# Patient Record
Sex: Male | Born: 1995 | Hispanic: No | Marital: Single | Smoking: Never smoker
Health system: Southern US, Community
[De-identification: ages and names within clinical notes are randomized; demographics above are authoritative.]

## PROBLEM LIST (undated history)

## (undated) HISTORY — PX: KNEE SURGERY: SHX244

---
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
## 2018-10-27 ENCOUNTER — Emergency Department (HOSPITAL_COMMUNITY): Payer: Self-pay

## 2018-10-27 ENCOUNTER — Emergency Department (HOSPITAL_COMMUNITY)
Admission: EM | Admit: 2018-10-27 | Discharge: 2018-10-27 | Disposition: A | Discharge status: Home / Self Care | Payer: Self-pay | Attending: Emergency Medicine | Admitting: Emergency Medicine

## 2018-10-27 ENCOUNTER — Encounter (HOSPITAL_COMMUNITY): Payer: Self-pay | Admitting: Emergency Medicine

## 2018-10-27 DIAGNOSIS — W108XXA Fall (on) (from) other stairs and steps, initial encounter: Secondary | ICD-10-CM | POA: Insufficient documentation

## 2018-10-27 DIAGNOSIS — M25561 Pain in right knee: Secondary | ICD-10-CM

## 2018-10-27 DIAGNOSIS — Y9302 Activity, running: Secondary | ICD-10-CM | POA: Insufficient documentation

## 2018-10-27 DIAGNOSIS — Y999 Unspecified external cause status: Secondary | ICD-10-CM | POA: Insufficient documentation

## 2018-10-27 DIAGNOSIS — S80211A Abrasion, right knee, initial encounter: Secondary | ICD-10-CM | POA: Insufficient documentation

## 2018-10-27 DIAGNOSIS — Y9289 Other specified places as the place of occurrence of the external cause: Secondary | ICD-10-CM | POA: Insufficient documentation

## 2018-10-27 DIAGNOSIS — M25461 Effusion, right knee: Secondary | ICD-10-CM | POA: Insufficient documentation

## 2018-10-27 MED ORDER — IBUPROFEN 200 MG PO TABS
600.0000 mg | ORAL_TABLET | Freq: Once | ORAL | Status: AC
  Administered 2018-10-27: 600 mg via ORAL
  Filled 2018-10-27: qty 3

## 2018-10-27 NOTE — ED Notes (Signed)
Pt declined crutches.

## 2018-10-27 NOTE — ED Triage Notes (Signed)
Pt reports ran up stairs to see friend who just got into town and fell. Pt c/o right knee pains.

## 2018-10-27 NOTE — ED Provider Notes (Signed)
[facility]-EMERGENCY DEPT Provider Note   CSN: [id number] Arrival date & time: 10/27/18  1050   History   Chief Complaint Chief Complaint  Patient presents with  . Knee Pain    HPI Kenneth Dixon is a 23 y.o. male presenting with constant sharp right anterior knee pain onset 1.5 hours ago. Patient states pain is non radiating. Patient reports he fell on concrete stairs while running up the stairs. Patient states he had an ACL repair 2-3 years ago on the right knee. Patient denies twisting knee or hearing a pop while the injury occurred. Patient reports associated right knee swelling and a small abrasion. Patient states symptoms improve with applying ice. Patient states he is unable to bear weight due to the pain. Patient denies numbness, weakness, or paresthesias. Patient denies fever, chills, nausea, vomiting, or abdominal pain. Patient denies any injuries in any other areas.   HPI  History reviewed. No pertinent past medical history.  There are no active problems to display for this patient.   Past Surgical History:  Procedure Laterality Date  . KNEE SURGERY          Home Medications    Prior to Admission medications   Not on File    Family History No family history on file.  Social History Social History   Tobacco Use  . Smoking status: Never Smoker  . Smokeless tobacco: Never Used  Substance Use Topics  . Alcohol use: Not on file  . Drug use: Not on file     Allergies   Patient has no known allergies.   Review of Systems Review of Systems  Constitutional: Negative for chills, diaphoresis and fever.  Respiratory: Negative for shortness of breath.   Cardiovascular: Negative for chest pain.  Gastrointestinal: Negative for abdominal pain, nausea and vomiting.  Endocrine: Negative for cold intolerance and heat intolerance.  Genitourinary: Negative for dysuria.  Musculoskeletal: Positive for arthralgias, gait problem and joint swelling.  Negative for back pain and myalgias.  Skin: Negative for color change and rash.  Allergic/Immunologic: Negative for immunocompromised state.  Neurological: Negative for weakness and numbness.  Hematological: Negative for adenopathy.     Physical Exam Updated Vital Signs BP 114/86 (BP Location: Left Arm)   Pulse 85   Temp 97.6 F (36.4 C)   Resp 18   SpO2 100%   Physical Exam Vitals signs and nursing note reviewed.  Constitutional:      General: He is not in acute distress.    Appearance: He is well-developed. He is not diaphoretic.  HENT:     Head: Normocephalic and atraumatic.  Cardiovascular:     Rate and Rhythm: Normal rate and regular rhythm.     Heart sounds: Normal heart sounds. No murmur. No friction rub. No gallop.   Pulmonary:     Effort: Pulmonary effort is normal. No respiratory distress.     Breath sounds: Normal breath sounds. No wheezing or rales.  Abdominal:     Palpations: Abdomen is soft.     Tenderness: There is no abdominal tenderness.  Musculoskeletal:     Right knee: He exhibits decreased range of motion (Decreased ROM with extension of knee due to pain. ), swelling and bony tenderness (Tenderness over patella noted. ). He exhibits no effusion, no ecchymosis, no deformity, no laceration, no erythema, normal alignment, no LCL laxity, normal meniscus and no MCL laxity. Tenderness (Anterior knee pain noted over small abrasion. ) found. No medial joint line, no lateral joint line,  no MCL, no LCL and no patellar tendon tenderness noted.     Left knee: Normal. He exhibits normal range of motion, no swelling and no effusion.     Comments: Negative anterior or posterior drawer test. No MCL or LCL laxity. Sensation intact. 5/5 strength with dorsiflexion and plantar flexion. 2+ DP pulses. Pt is able to ambulate, but experiences some pain with ambulation.   Skin:    General: Skin is warm.     Findings: No erythema or rash.  Neurological:     Mental Status: He is  alert.      ED Treatments / Results  Labs (all labs ordered are listed, but only abnormal results are displayed) Labs Reviewed - No data to display  EKG None  Radiology Dg Knee Complete 4 Views Right  Result Date: 10/27/2018 CLINICAL DATA:  Tripped and fell with subsequent pain. EXAM: RIGHT KNEE - COMPLETE 4+ VIEW COMPARISON:  None. FINDINGS: Small knee joint effusion. Previous tunneled ACL repair. No evidence of degenerative change. No acute traumatic bone finding. IMPRESSION: Small joint effusion. Otherwise unremarkable appearance following tunneled ACL repair. Electronically Signed   By: Paulina Fusi M.D.   On: 10/27/2018 13:03    Procedures Procedures (including critical care time)  Medications Ordered in ED Medications  ibuprofen (ADVIL,MOTRIN) tablet 600 mg (600 mg Oral Given 10/27/18 1258)     Initial Impression / Assessment and Plan / ED Course  I have reviewed the triage vital signs and the nursing notes.  Pertinent labs & imaging results that were available during my care of the patient were reviewed by me and considered in my medical decision making (see chart for details).  Clinical Course as of Oct 28 1431  Wed Oct 27, 2018  1308 CXR reveals small joint effusion. Otherwise unremarkable appearance following tunneled ACL repair.    DG Knee Complete 4 Views Right [AH]    Clinical Course User Index [AH] Leretha Dykes, PA-C   Patient X-Ray negative for obvious fracture or dislocation. Pain managed in ED. Pt advised to follow up with orthopedics if symptoms persist for possibility of missed fracture diagnosis. Patient given brace while in ED, conservative therapy recommended and discussed. Patient will be dc home & is agreeable with above plan.   Final Clinical Impressions(s) / ED Diagnoses   Final diagnoses:  Acute pain of right knee    ED Discharge Orders    None       Leretha Dykes, [place] 10/27/18 1433    Doug Sou, MD 10/27/18 [phone]

## 2018-10-27 NOTE — Discharge Instructions (Addendum)
Today you were evaluated for knee pain.   1. Medications: alternate ibuprofen and tylenol for pain control, usual home medications 2. Treatment: rest, ice, elevate and use brace, drink plenty of fluids, gentle stretching 3. Follow Up: Please followup with orthopedics as directed or your PCP in 1 week if no improvement for discussion of your diagnoses and further evaluation after today's visit; if you do not have a primary care doctor use the resource guide provided to find one; Please return to the ER for worsening symptoms or other concerns  Emergency Department Resource Guide 1) Find a Doctor and Pay Out of Pocket Although you won't have to find out who is covered by your insurance plan, it is a good idea to ask around and get recommendations. You will then need to call the office and see if the doctor you have chosen will accept you as a new patient and what types of options they offer for patients who are self-pay. Some doctors offer discounts or will set up payment plans for their patients who do not have insurance, but you will need to ask so you aren't surprised when you get to your appointment.  2) Contact Your Local Health Department Not all health departments have doctors that can see patients for sick visits, but many do, so it is worth a call to see if yours does. If you don't know where your local health department is, you can check in your phone book. The CDC also has a tool to help you locate your state's health department, and many state websites also have listings of all of their local health departments.  3) Find a Walk-in Clinic If your illness is not likely to be very severe or complicated, you may want to try a walk in clinic. These are popping up all over the country in pharmacies, drugstores, and shopping centers. They're usually staffed by nurse practitioners or physician assistants that have been trained to treat common illnesses and complaints. They're usually fairly quick and  inexpensive. However, if you have serious medical issues or chronic medical problems, these are probably not your best option.  No Primary Care Doctor: Call Health Connect at  [phone] - they can help you locate a primary care doctor that  accepts your insurance, provides certain services, etc. Physician Referral [phone]- [phone]  Emergency Department Resource Guide 1) Find a Doctor and Pay Out of Pocket Although you won't have to find out who is covered by your insurance plan, it is a good idea to ask around and get recommendations. You will then need to call the office and see if the doctor you have chosen will accept you as a new patient and what types of options they offer for patients who are self-pay. Some doctors offer discounts or will set up payment plans for their patients who do not have insurance, but you will need to ask so you aren't surprised when you get to your appointment.  2) Contact Your Local Health Department Not all health departments have doctors that can see patients for sick visits, but many do, so it is worth a call to see if yours does. If you don't know where your local health department is, you can check in your phone book. The CDC also has a tool to help you locate your state's health department, and many state websites also have listings of all of their local health departments.  3) Find a Walk-in Clinic If your illness is not likely to be very  severe or complicated, you may want to try a walk in clinic. These are popping up all over the country in pharmacies, drugstores, and shopping centers. They're usually staffed by nurse practitioners or physician assistants that have been trained to treat common illnesses and complaints. They're usually fairly quick and inexpensive. However, if you have serious medical issues or chronic medical problems, these are probably not your best option.  No Primary Care Doctor: Call Health Connect at  [phone] - they can help you  locate a primary care doctor that  accepts your insurance, provides certain services, etc. Physician Referral Service- [phone]  Chronic Pain Problems: Organization         Address  Phone   Notes  [facility]  [phone] Patients need to be referred by their primary care doctor.   Medication Assistance: Organization         Address  Phone   Notes  [facility] Medication [facility] [address] [place], [place] [address] [phone] --Must be a resident of [facility] -- Must have NO insurance coverage whatsoever (no Medicaid/ Medicare, etc.) -- The pt. MUST have a primary care doctor that directs their care regularly and follows them in the community   [facility]  [phone]   [employer]  [phone]    Agencies that provide inexpensive medical care: Organization         Address  Phone   Notes  [facility]  [phone]   [facility]    [phone]   [facility] [address] [place], [place] [address] [phone]   [facility] [address] [place] [place] [phone]   [facility]    [phone]   [facility]    [phone]   [facility]  [address], [place] Phone:  [phone], Fax:  [phone] Hours of Operation:  9 am - 6 pm, M-F.  Also accepts Medicaid/Medicare and self-pay.  [facility]  [address], [place] Phone: [phone], Fax: [phone]. Hours of Operation:  8:30 am - 5:30 pm, M-F.  Also accepts Medicaid and self-pay.  [facility] [place] [address] [place] Phone: [phone]   [facility] [address] Natasha Bence [place], [place] [phone], Ext. [phone] Mondays & Thursdays: 7-9 AM.  First 15 patients are seen on a first come, first serve basis.    Medicaid-accepting [facility]  Providers:  Organization         Address  Phone   Notes  [facility] [address], [place] [phone] Also accepts self-pay patients.  [facility] [address] Laurell Josephs [place], [place]  [phone]   [facility] [address], [place] [phone]   [facility] [address] [place] [phone]   Renaye Rakers [address], [place]   [phone] Only accepts [place] patients after they have their name applied to their card.   Self-Pay (no insurance) in [facility]:  Organization         Address  Phone   Notes  Sickle Cell Patients, [facility] [address] [place], [place] [phone]   [facility] [address]  [address], [place] [phone]   [facility] [place]  [place], [place], [place] [phone]   [facility]/Dr. Osei-Bonsu  [address] [place] or [address], [place] [phone] Phone number for both [place] and [place] locations is the same.  [facility] [address], [place] [phone]   [facility] [address], [place] or [address] [phone]   [facility] [address], [place] [phone], phone; [phone], fax Sees patients 1st and 3rd Saturday of every month.  Must not qualify for public or private insurance (i.e. Medicaid, Medicare, [place] Health Choice, Veterans' Benefits)  Household income should be no more than 200% of the poverty level The clinic cannot treat you if you are pregnant or think you are pregnant  Sexually transmitted diseases are not treated at the clinic.

## 2019-03-17 ENCOUNTER — Other Ambulatory Visit: Payer: Self-pay

## 2019-03-17 ENCOUNTER — Emergency Department (HOSPITAL_COMMUNITY)
Admission: EM | Admit: 2019-03-17 | Discharge: 2019-03-17 | Disposition: A | Discharge status: Home / Self Care | Payer: Self-pay | Attending: Emergency Medicine | Admitting: Emergency Medicine

## 2019-03-17 DIAGNOSIS — F19950 Other psychoactive substance use, unspecified with psychoactive substance-induced psychotic disorder with delusions: Secondary | ICD-10-CM

## 2019-03-17 DIAGNOSIS — F16122 Hallucinogen abuse with intoxication with perceptual disturbance: Secondary | ICD-10-CM | POA: Insufficient documentation

## 2019-03-17 DIAGNOSIS — R000 Tachycardia, unspecified: Secondary | ICD-10-CM | POA: Insufficient documentation

## 2019-03-17 DIAGNOSIS — F1910 Other psychoactive substance abuse, uncomplicated: Secondary | ICD-10-CM

## 2019-03-17 DIAGNOSIS — F22 Delusional disorders: Secondary | ICD-10-CM

## 2019-03-17 LAB — COMPREHENSIVE METABOLIC PANEL
ALT: 17 U/L (ref 0–44)
AST: 24 U/L (ref 15–41)
Albumin: 4.7 g/dL (ref 3.5–5.0)
Alkaline Phosphatase: 55 U/L (ref 38–126)
Anion gap: 18 — ABNORMAL HIGH (ref 5–15)
BUN: 8 mg/dL (ref 6–20)
CO2: 20 mmol/L — ABNORMAL LOW (ref 22–32)
Calcium: 9.7 mg/dL (ref 8.9–10.3)
Chloride: 105 mmol/L (ref 98–111)
Creatinine, Ser: 0.92 mg/dL (ref 0.61–1.24)
GFR calc Af Amer: >60 mL/min (ref >60)
GFR calc non Af Amer: >60 mL/min (ref >60)
Glucose, Bld: 107 mg/dL — ABNORMAL HIGH (ref 70–99)
Potassium: 2.9 mmol/L — ABNORMAL LOW (ref 3.5–5.1)
Sodium: 143 mmol/L (ref 135–145)
Total Bilirubin: 1 mg/dL (ref 0.3–1.2)
Total Protein: 7.5 g/dL (ref 6.5–8.1)

## 2019-03-17 LAB — RAPID URINE DRUG SCREEN, HOSP PERFORMED
Amphetamines: NOT DETECTED
Barbiturates: NOT DETECTED
Benzodiazepines: NOT DETECTED
Cocaine: NOT DETECTED
Opiates: NOT DETECTED
Tetrahydrocannabinol: POSITIVE — AB

## 2019-03-17 LAB — CBC WITH DIFFERENTIAL/PLATELET
Abs Immature Granulocytes: 0.02 10*3/uL (ref 0.00–0.07)
Basophils Absolute: 0 10*3/uL (ref 0.0–0.1)
Basophils Relative: 1 %
Eosinophils Absolute: 0.1 10*3/uL (ref 0.0–0.5)
Eosinophils Relative: 1 %
HCT: 45 % (ref 39.0–52.0)
Hemoglobin: 15.6 g/dL (ref 13.0–17.0)
Immature Granulocytes: 0 %
Lymphocytes Relative: 22 %
Lymphs Abs: 1.2 10*3/uL (ref 0.7–4.0)
MCH: 31 pg (ref 26.0–34.0)
MCHC: 34.7 g/dL (ref 30.0–36.0)
MCV: 89.3 fL (ref 80.0–100.0)
Monocytes Absolute: 0.5 10*3/uL (ref 0.1–1.0)
Monocytes Relative: 9 %
Neutro Abs: 3.7 10*3/uL (ref 1.7–7.7)
Neutrophils Relative %: 67 %
Platelets: 290 10*3/uL (ref 150–400)
RBC: 5.04 MIL/uL (ref 4.22–5.81)
RDW: 12.1 % (ref 11.5–15.5)
WBC: 5.5 10*3/uL (ref 4.0–10.5)
nRBC: 0 % (ref 0.0–0.2)

## 2019-03-17 LAB — SALICYLATE LEVEL: Salicylate Lvl: <7 mg/dL (ref 2.8–30.0)

## 2019-03-17 LAB — ETHANOL: Alcohol, Ethyl (B): 27 mg/dL — ABNORMAL HIGH (ref <10)

## 2019-03-17 LAB — ACETAMINOPHEN LEVEL: Acetaminophen (Tylenol), Serum: <10 ug/mL — ABNORMAL LOW (ref 10–30)

## 2019-03-17 MED ORDER — LORAZEPAM 2 MG/ML IJ SOLN: 2.0000 mg | Freq: Once | INTRAMUSCULAR | Status: DC

## 2019-03-17 MED ORDER — LORAZEPAM 2 MG/ML IJ SOLN
2.0000 mg | Freq: Once | INTRAMUSCULAR | Status: AC
  Administered 2019-03-17: 2 mg via INTRAMUSCULAR
  Filled 2019-03-17: qty 1

## 2019-03-17 MED ORDER — SODIUM CHLORIDE 0.9 % IV BOLUS: 500.0000 mL | Freq: Once | INTRAVENOUS | Status: DC

## 2019-03-17 MED ORDER — SODIUM CHLORIDE 0.9 % IV BOLUS: 1000.0000 mL | Freq: Once | INTRAVENOUS | Status: DC

## 2019-03-17 MED ORDER — SODIUM CHLORIDE 0.9 % IV SOLN: INTRAVENOUS | Status: DC

## 2019-03-17 NOTE — ED Provider Notes (Signed)
[facility] EMERGENCY DEPARTMENT Provider Note  CSN: [id number] Arrival date & time: 03/17/19 0458  Chief Complaint(s) Paranoid  HPI Kenneth Dixon is a 23 y.o. male brought in by [facility] and EMS for excited delirium.  Patient endorsed using LSD and nitrous oxide at a party.  He called 911 from a campus Blue light call system stating that he saw dead people in his apartment.  Patient continues to ask about his brother Kenneth Dixon who lives in [place], asking whether he is alive or dead.  Remainder of history, ROS, and physical exam limited due to patient's condition (excited delirium). Additional information was obtained from EMS in [employer].   Level V Caveat.    HPI  Past Medical History No past medical history on file. There are no active problems to display for this patient.  Home Medication(s) Prior to Admission medications   Not on File                                                                                                                                    Past Surgical History Past Surgical History:  Procedure Laterality Date  . KNEE SURGERY     Family History No family history on file.  Social History Social History   Tobacco Use  . Smoking status: Never Smoker  . Smokeless tobacco: Never Used  Substance Use Topics  . Alcohol use: Not on file  . Drug use: Not on file   Allergies Patient has no known allergies.  Review of Systems Review of Systems  Unable to perform ROS: Mental status change    Physical Exam Vital Signs  I have reviewed the triage vital signs BP (!) 156/100 (BP Location: Right Arm)   Pulse (!) 115   Temp 98.5 F (36.9 C) (Oral)   SpO2 99%   Physical Exam Vitals signs reviewed.  Constitutional:      General: He is not in acute distress.    Appearance: He is well-developed. He is not diaphoretic.  HENT:     Head: Normocephalic and atraumatic.     Nose: Nose normal.  Eyes:     General: No scleral icterus.     Right eye: No discharge.        Left eye: No discharge.     Conjunctiva/sclera: Conjunctivae normal.     Pupils: Pupils are equal, round, and reactive to light.  Neck:     Musculoskeletal: Normal range of motion and neck supple.  Cardiovascular:     Rate and Rhythm: Regular rhythm. Tachycardia present.     Heart sounds: No murmur. No friction rub. No gallop.   Pulmonary:     Effort: Pulmonary effort is normal. No respiratory distress.     Breath sounds: Normal breath sounds. No stridor. No rales.  Abdominal:     General: There is no distension.     Palpations: Abdomen is soft.  Tenderness: There is no abdominal tenderness.  Musculoskeletal:        General: No tenderness.  Skin:    General: Skin is warm and dry.     Findings: No erythema or rash.       Neurological:     Mental Status: He is alert and oriented to person, place, and time.     Motor: Motor function is intact.     ED Results and Treatments Labs (all labs ordered are listed, but only abnormal results are displayed) Labs Reviewed  CBC WITH DIFFERENTIAL/PLATELET  COMPREHENSIVE METABOLIC PANEL  SALICYLATE LEVEL  ACETAMINOPHEN LEVEL  ETHANOL  RAPID URINE DRUG SCREEN, HOSP PERFORMED  CBG MONITORING, ED                                                                                                                         EKG  EKG Interpretation  Date/Time:  Thursday Mar 17 2019 05:09:23 EDT Ventricular Rate:  113 PR Interval:    QRS Duration: 86 QT Interval:  321 QTC Calculation: 441 R Axis:   81 Text Interpretation:  Sinus tachycardia Right atrial enlargement RSR' in V1 or V2, probably normal variant LVH by voltage No old tracing to compare Confirmed by Drema Pryardama, Sharrie Self [phone]([id number]) on 03/17/2019 6:41:51 AM      Radiology No results found. Pertinent labs & imaging results that were available during my care of the patient were reviewed by me and considered in my medical decision making (see chart for details).   Medications Ordered in ED Medications  sodium chloride 0.9 % bolus 1,000 mL (has no administration in time range)    And  sodium chloride 0.9 % bolus 1,000 mL (has no administration in time range)    And  0.9 %  sodium chloride infusion (has no administration in time range)  LORazepam (ATIVAN) injection 2 mg (has no administration in time range)  LORazepam (ATIVAN) injection 2 mg (2 mg Intramuscular Given 03/17/19 0526)                                                                                                                                    Procedures Procedures  (including critical care time)  Medical Decision Making / ED Course I have reviewed the nursing notes for this encounter and the patient's prior records (if available in EHR or on provided paperwork).    Patient presents with  excited delirium in the setting of reported LSD and nitrous oxide use.  Required chemical sedation for agitation. No signs of acute trauma.  Screening labs obtained.  EKG with sinus tachycardia, and nonischemic.  Intervals within normal limits. Will provide with IV fluids and allow patient to metabolize.  Patient care turned over to Dr Jodi Mourning at 0700. Patient case and results discussed in detail; please see their note for further ED managment.      Final Clinical Impression(s) / ED Diagnoses Final diagnoses:  Substance abuse (HCC)  Paranoia (HCC)  Drug induced delusional state (HCC)      This chart was dictated using voice recognition software.  Despite best efforts to proofread,  errors can occur which can change the documentation meaning.   Nira Conn, MD 03/17/19 [phone]

## 2019-03-17 NOTE — ED Notes (Signed)
Pt is Awake, Alert and Oriented at this time.  VSS.  EDP made aware that pt is asking to go home.

## 2019-03-17 NOTE — ED Provider Notes (Signed)
Pt signed out by Dr. Jodi Mourning.  Pt has slept most of the day.  He is now awake and alert.  He said he used acid and had a bad trip.  He is encouraged to avoid any drugs of abuse.  He is given a Facilities manager for outpatient substance abuse help.  Return if worse.     Jacalyn Lefevre, MD 03/17/19 1346

## 2019-03-17 NOTE — Discharge Instructions (Signed)
Avoid any drugs of abuse.

## 2019-03-17 NOTE — ED Triage Notes (Signed)
Per EMS pt was found by [employer] tonight after going to a party. Pt admitted to taking LSD and whip its. Pt has no complaints but states he was seeing dead bodies. Pt appears anxious  and paranoid. Pt alert and oriented x4.

## 2019-03-17 NOTE — ED Provider Notes (Signed)
Patient's care signed out to continue to monitor after he admitted to doing LSD and nitrous oxide at a party.  Patient is done this before.  I discussed with him the risks of this which include possible death.  Patient is feeling better plan to continue to monitor.  Patient's heart rate 110 in the room.     Blane Ohara, MD 03/18/19 1505

## 2021-06-17 ENCOUNTER — Encounter (HOSPITAL_COMMUNITY): Payer: Self-pay

## 2021-06-17 ENCOUNTER — Other Ambulatory Visit: Payer: Self-pay

## 2021-06-17 ENCOUNTER — Emergency Department (HOSPITAL_COMMUNITY)
Admission: EM | Admit: 2021-06-17 | Discharge: 2021-06-18 | Disposition: A | Discharge status: Home / Self Care | Payer: Self-pay | Attending: Emergency Medicine | Admitting: Emergency Medicine

## 2021-06-17 DIAGNOSIS — T148XXA Other injury of unspecified body region, initial encounter: Secondary | ICD-10-CM

## 2021-06-17 DIAGNOSIS — S5012XA Contusion of left forearm, initial encounter: Secondary | ICD-10-CM | POA: Insufficient documentation

## 2021-06-17 DIAGNOSIS — X58XXXA Exposure to other specified factors, initial encounter: Secondary | ICD-10-CM | POA: Insufficient documentation

## 2021-06-17 DIAGNOSIS — R58 Hemorrhage, not elsewhere classified: Secondary | ICD-10-CM | POA: Insufficient documentation

## 2021-06-17 NOTE — ED Triage Notes (Signed)
Pt has a bruise to his left arm that has a small knot under it x 7 days.

## 2021-06-18 NOTE — ED Provider Notes (Signed)
[facility]-EMERGENCY DEPT Provider Note   CSN: [id number] Arrival date & time: 06/17/21  2217     History Chief Complaint  Patient presents with   Bleeding/Bruising    Kenneth Dixon is a 25 y.o. male.  Patient to ED for evaluation of a tender knot on the volar left forearm he noticed a week ago, and bruising adjacent to the knot that appeared yesterday. No injury or trauma. No redness, wound, bleeding or drainage. He is right hand dominant. He denies history of easy bleeding or blood dyscrasias. No associated swelling of the forearm. No history of similar symptoms.   The history is provided by the patient. No language interpreter was used.      History reviewed. No pertinent past medical history.  There are no problems to display for this patient.   Past Surgical History:  Procedure Laterality Date   KNEE SURGERY         History reviewed. No pertinent family history.  Social History   Tobacco Use   Smoking status: Never   Smokeless tobacco: Never  Vaping Use   Vaping Use: Never used    Home Medications Prior to Admission medications   Not on File    Allergies    Patient has no known allergies.  Review of Systems   Review of Systems  Constitutional:  Negative for diaphoresis and fever.  Musculoskeletal:        See HPI.  Skin:  Positive for color change.  Hematological:  Does not bruise/bleed easily.   Physical Exam Updated Vital Signs BP (!) 142/82 (BP Location: Right Arm)   Pulse 83   Temp 98 F (36.7 C) (Oral)   Resp 16   SpO2 100%   Physical Exam Vitals and nursing note reviewed.  Constitutional:      Appearance: He is well-developed.  Pulmonary:     Effort: Pulmonary effort is normal.  Musculoskeletal:        General: No deformity. Normal range of motion.     Cervical back: Normal range of motion.     Comments: There is a 1 cm firm, mobile nodule midshaft volar forearm on left. No redness, fluctuance. FROM all  joints of the left arm, wrist and hand.   Skin:    General: Skin is warm and dry.     Findings: Bruising (Faint bruising midshaft volar forearm.) present.  Neurological:     Mental Status: He is alert and oriented to person, place, and time.    ED Results / Procedures / Treatments   Labs (all labs ordered are listed, but only abnormal results are displayed) Labs Reviewed - No data to display  EKG None  Radiology No results found.  Procedures Procedures   Medications Ordered in ED Medications - No data to display  ED Course  I have reviewed the triage vital signs and the nursing notes.  Pertinent labs & imaging results that were available during my care of the patient were reviewed by me and considered in my medical decision making (see chart for details).    MDM Rules/Calculators/A&P                           Patient to ED with ss/sxs as per HPI.   Very well appearing patient. Exam findings limited to nodule left forearm and mild bruising. No swelling or redness. Distal pulse normal. He denies easy bleeding - no bleeding when brushing his teeth, or  prolonged clotting after minor injury. VSS.   DDX nodule - ganglion cyst vs developing abscess felt less likely vs lipoma. Unknown cause of bruising but not felt related to the nodule the 7 days difference in onset.   Patient reassured. He can be discharged and PCP follow up is recommended if symptoms do not resolve.   Final Clinical Impression(s) / ED Diagnoses Final diagnoses:  None   bruising  Rx / DC Orders ED Discharge Orders     None        Elpidio Anis, PA-C 06/23/21 [id number]    Mesner, Barbara Cower, MD 06/24/21 2310

## 2021-06-18 NOTE — Discharge Instructions (Addendum)
Your exam today is not concerning for any serious health condition.   If your symptoms persist, follow up with primary care. A referral has been provided for you but you can follow up with your choice of health care providers.

## 2021-10-29 ENCOUNTER — Ambulatory Visit (HOSPITAL_COMMUNITY): Payer: Self-pay

## 2022-01-12 ENCOUNTER — Ambulatory Visit (HOSPITAL_COMMUNITY)
Admission: EM | Admit: 2022-01-12 | Discharge: 2022-01-12 | Disposition: A | Discharge status: Home / Self Care | Payer: PRIVATE HEALTH INSURANCE | Attending: Nurse Practitioner | Admitting: Nurse Practitioner

## 2022-01-12 ENCOUNTER — Ambulatory Visit (INDEPENDENT_AMBULATORY_CARE_PROVIDER_SITE_OTHER): Payer: PRIVATE HEALTH INSURANCE

## 2022-01-12 ENCOUNTER — Encounter (HOSPITAL_COMMUNITY): Payer: Self-pay | Admitting: *Deleted

## 2022-01-12 ENCOUNTER — Other Ambulatory Visit: Payer: Self-pay

## 2022-01-12 DIAGNOSIS — M25531 Pain in right wrist: Secondary | ICD-10-CM

## 2022-01-12 NOTE — Discharge Instructions (Signed)
-   Your wrist x-ray today is negative for acute fracture ?-You can take Tylenol/ibuprofen for the pain; you can also use ice ?-If you are doing something where the wrist might be strained, you can use a wrist brace ?-If the wrist pain does not improve over the next few weeks, follow-up with a specialist below-phone numbers are provided ?

## 2022-01-12 NOTE — ED Triage Notes (Signed)
Pt reports hurting the RT wrist one month ago while digging a hole. ?

## 2022-01-12 NOTE — ED Provider Notes (Signed)
?[facility] ? ? ? ?CSN: [id number] ?Arrival date & time: 01/12/22  1509 ? ? ?  ? ?History   ?Chief Complaint ?Chief Complaint  ?Patient presents with  ? Wrist Pain  ? ? ?HPI ?Kenneth Dixon is a 26 y.o. male.  ? ?Patient here for 1 month history of right wrist pain.  Reports he was digging a hole about 1 month ago and hit a rock with his metal shovel sending pain to his right wrist.  Additionally, a couple weeks ago, he fell on some stairs and felt a sharp pain in his right wrist.  He reports his wrist hurts when he turns it towards him.  He denies any fevers, numbness or tingling in his fingers, nausea/vomiting.  He has not taken anything for the pain or tried any bracing. ? ? ?History reviewed. No pertinent past medical history. ? ?There are no problems to display for this patient. ? ? ?Past Surgical History:  ?Procedure Laterality Date  ? KNEE SURGERY    ? ? ? ? ? ?Home Medications   ? ?Prior to Admission medications   ?Not on File  ? ? ?Family History ?History reviewed. No pertinent family history. ? ?Social History ?Social History  ? ?Tobacco Use  ? Smoking status: Never  ? Smokeless tobacco: Never  ?Vaping Use  ? Vaping Use: Never used  ? ? ? ?Allergies   ?Patient has no known allergies. ? ? ?Review of Systems ?Review of Systems ?Per HPI ? ?Physical Exam ?Triage Vital Signs ?ED Triage Vitals  ?Enc Vitals Group  ?   BP 01/12/22 1622 118/71  ?   Pulse Rate 01/12/22 1622 76  ?   Resp 01/12/22 1622 18  ?   Temp 01/12/22 1622 98 ?F (36.7 ?C)  ?   Temp src --   ?   SpO2 01/12/22 1622 98 %  ?   Weight --   ?   Height --   ?   Head Circumference --   ?   Peak Flow --   ?   Pain Score 01/12/22 1620 6  ?   Pain Loc --   ?   Pain Edu? --   ?   Excl. in GC? --   ? ?No data found. ? ?Updated Vital Signs ?BP 118/71   Pulse 76   Temp 98 ?F (36.7 ?C)   Resp 18   SpO2 98%  ? ?Visual Acuity ?Right Eye Distance:   ?Left Eye Distance:   ?Bilateral Distance:   ? ?Right Eye Near:   ?Left Eye Near:    ?Bilateral Near:     ? ?Physical Exam ?Vitals and nursing note reviewed.  ?Constitutional:   ?   General: He is not in acute distress. ?   Appearance: Normal appearance. He is not toxic-appearing.  ?Pulmonary:  ?   Effort: Pulmonary effort is normal. No respiratory distress.  ?Musculoskeletal:  ?   Right wrist: Bony tenderness present. No swelling, deformity or crepitus. Normal range of motion. Normal pulse.  ?   Left wrist: Normal.  ?   Right hand: Normal. Normal capillary refill. Normal pulse.  ?     Arms: ? ?   Comments: Tender to palpation in the area marked.  Full range of motion, although internal rotation is painful  ?Skin: ?   General: Skin is warm and dry.  ?   Coloration: Skin is not jaundiced or pale.  ?   Findings: No erythema.  ?Neurological:  ?  Mental Status: He is alert and oriented to person, place, and time.  ?   Motor: No weakness.  ?   Gait: Gait normal.  ?Psychiatric:     ?   Mood and Affect: Mood normal.     ?   Behavior: Behavior normal.     ?   Thought Content: Thought content normal.     ?   Judgment: Judgment normal.  ? ? ? ?UC Treatments / Results  ?Labs ?(all labs ordered are listed, but only abnormal results are displayed) ?Labs Reviewed - No data to display ? ?EKG ? ? ?Radiology ?DG Wrist Complete Right ? ?Result Date: 01/12/2022 ?CLINICAL DATA:  Pain EXAM: RIGHT WRIST - COMPLETE 3+ VIEW COMPARISON:  None. FINDINGS: There is no evidence of fracture or dislocation. There is no evidence of arthropathy or other focal bone abnormality. Soft tissues are unremarkable. IMPRESSION: No radiographic abnormality is seen in the right wrist. Electronically Signed   By: Elmer Picker M.D.   On: 01/12/2022 17:34   ? ?Procedures ?Procedures (including critical care time) ? ?Medications Ordered in UC ?Medications - No data to display ? ?Initial Impression / Assessment and Plan / UC Course  ?I have reviewed the triage vital signs and the nursing notes. ? ?Pertinent labs & imaging results that were available during my  care of the patient were reviewed by me and considered in my medical decision making (see chart for details). ? ?  ?Wrist x-ray today is negative for acute bony fracture.  This was discussed with the patient.  Patient declined a wrist brace today.  I encouraged the patient to wear wrist brace whenever he is doing activity that may harm the wrist.  Additionally, he can use Tylenol and ibuprofen for the pain.  He can also use ice.  Follow-up with sports medicine if wrist pain persist more than 4 to 6 weeks.  If wrist pain worsens, follow-up with orthopedic provider-numbers provided.  The patient was given the opportunity to ask questions.  All questions answered to their satisfaction.  The patient is in agreement to this plan.  ? ?Final Clinical Impressions(s) / UC Diagnoses  ? ?Final diagnoses:  ?Right wrist pain  ? ? ? ?Discharge Instructions   ? ?  ?- Your wrist x-ray today is negative for acute fracture ?-You can take Tylenol/ibuprofen for the pain; you can also use ice ?-If you are doing something where the wrist might be strained, you can use a wrist brace ?-If the wrist pain does not improve over the next few weeks, follow-up with a specialist below-phone numbers are provided ? ? ? ? ?ED Prescriptions   ?None ?  ? ?PDMP not reviewed this encounter. ?  ?Eulogio Bear, NP ?01/12/22 1751 ? ?

## 2022-09-27 IMAGING — DX DG WRIST COMPLETE 3+V*R*
4 series · 4 of 4 positions shown · non-contrast
Comparison: None.

CLINICAL DATA: Pain

EXAM:
RIGHT WRIST - COMPLETE 3+ VIEW

[wrist pa]
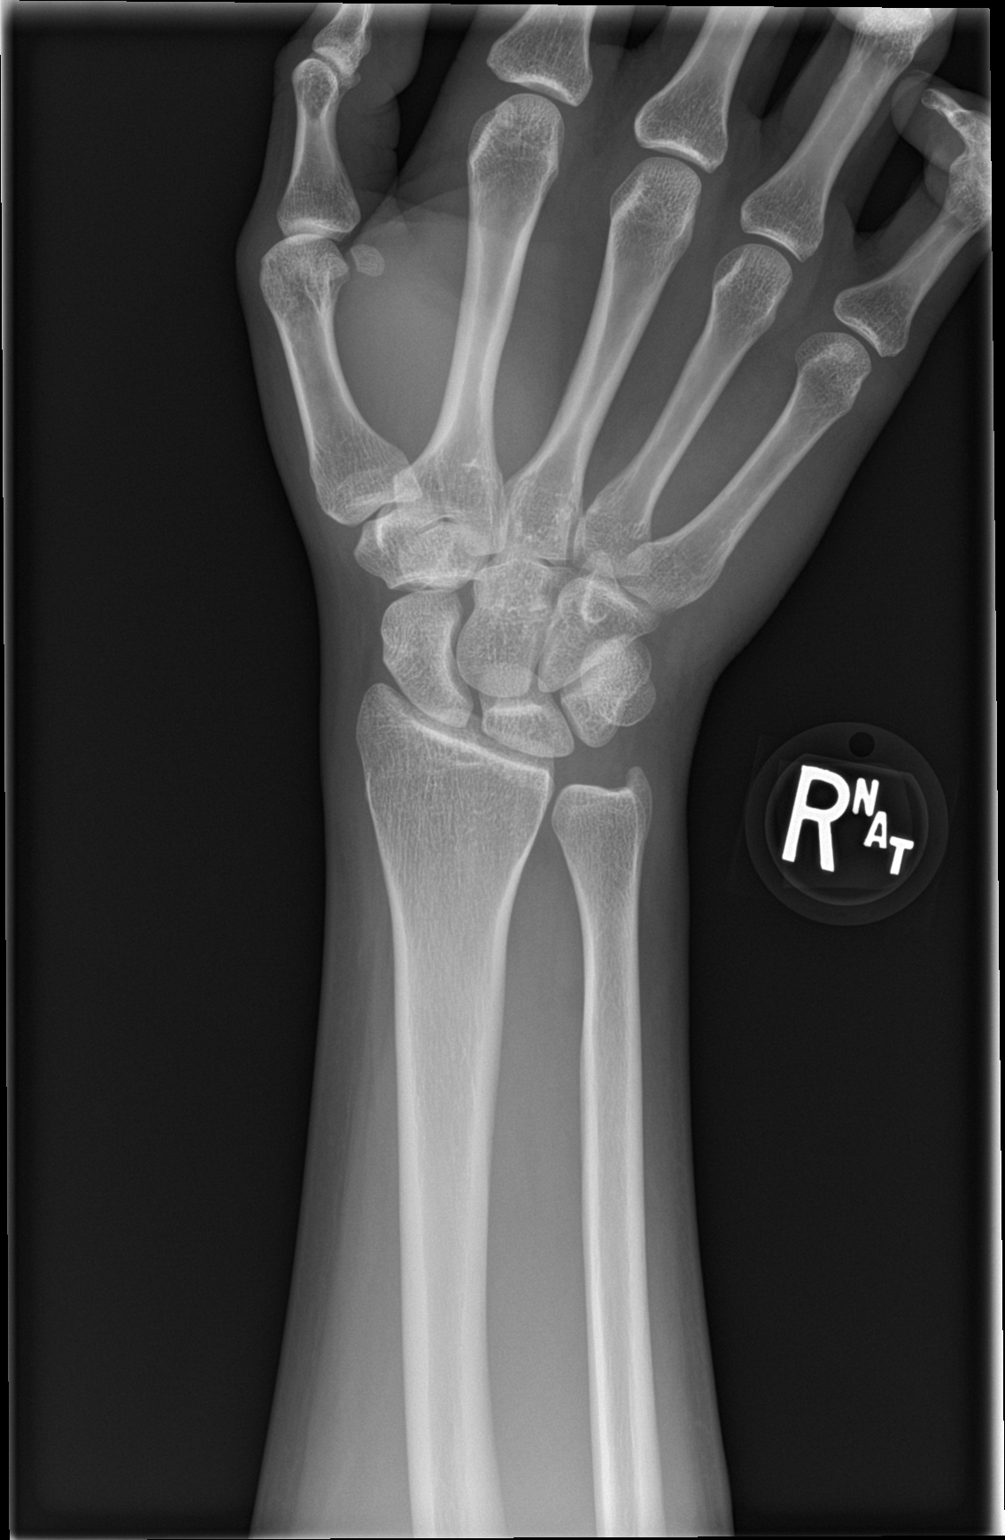

[wrist navicular]
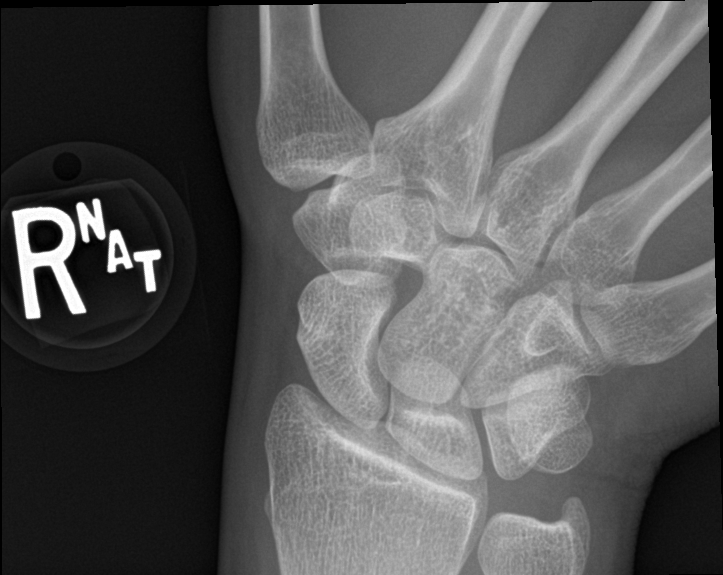

[wrist obl]
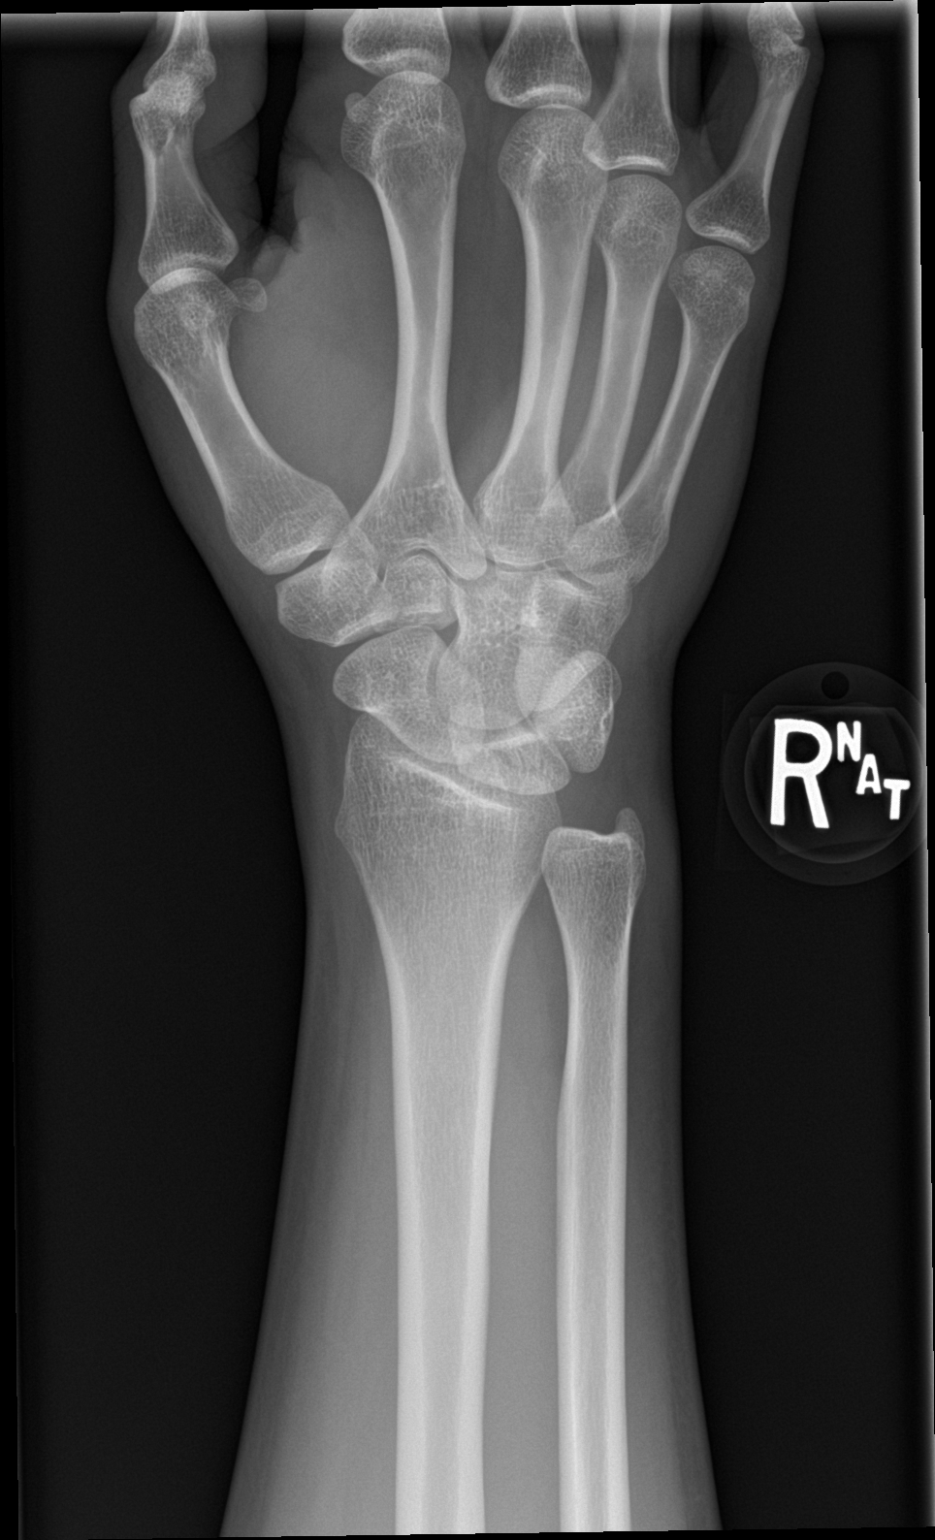

[wrist lat]
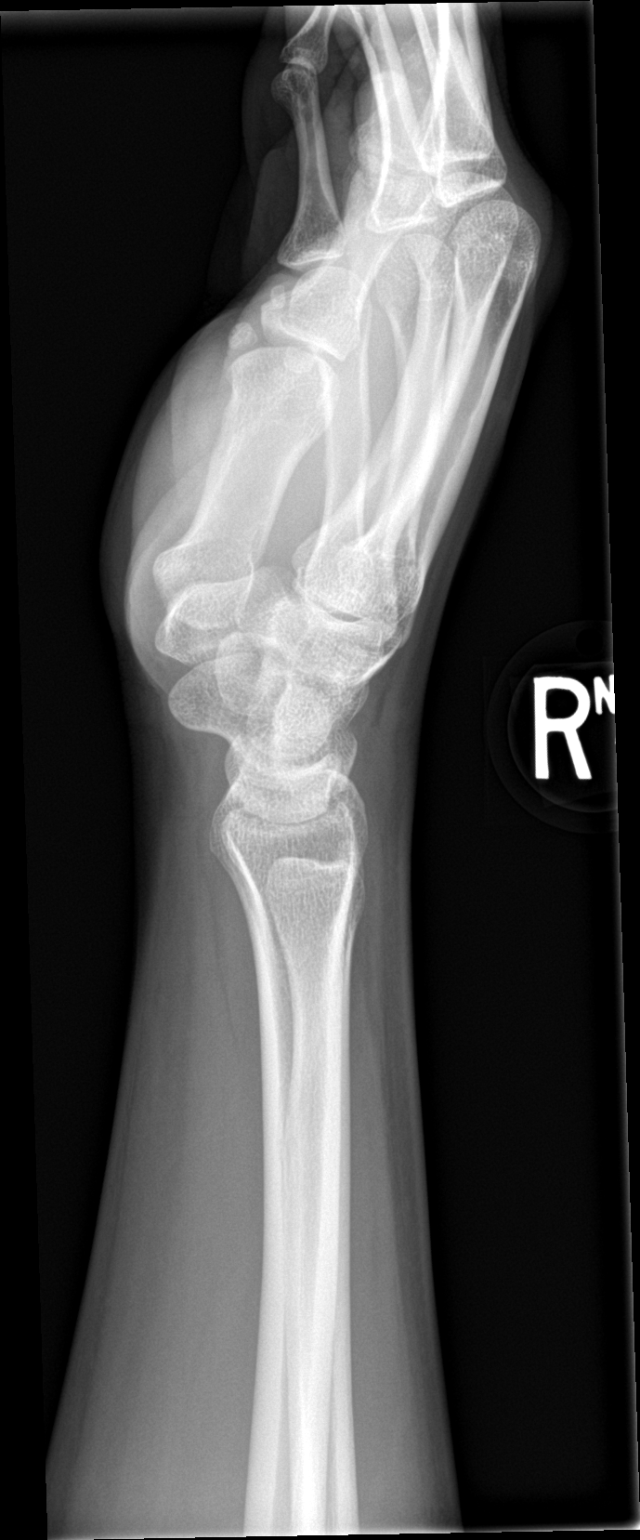

[4 of 4 positions shown; findings below may reference images not displayed]

FINDINGS: There is no evidence of fracture or dislocation. There is no
evidence of arthropathy or other focal bone abnormality. Soft
tissues are unremarkable.
IMPRESSION: No radiographic abnormality is seen in the right wrist.
